# Patient Record
Sex: Female | Born: 1970 | Race: Black or African American | Hispanic: No | Marital: Single | Smoking: Never smoker
Health system: Southern US, Community
[De-identification: ages and names within clinical notes are randomized; demographics above are authoritative.]

## PROBLEM LIST (undated history)

## (undated) DIAGNOSIS — I10 Essential (primary) hypertension: Secondary | ICD-10-CM

## (undated) HISTORY — PX: GALLBLADDER SURGERY: SHX652

---

## 2004-07-31 ENCOUNTER — Emergency Department: Payer: Self-pay | Admitting: Emergency Medicine

## 2004-10-11 ENCOUNTER — Emergency Department: Payer: Self-pay | Admitting: Emergency Medicine

## 2005-04-11 ENCOUNTER — Emergency Department (HOSPITAL_COMMUNITY): Admission: EM | Admit: 2005-04-11 | Discharge: 2005-04-11 | Payer: Self-pay | Admitting: Emergency Medicine

## 2007-01-31 ENCOUNTER — Emergency Department: Payer: Self-pay | Admitting: Emergency Medicine

## 2008-11-13 ENCOUNTER — Emergency Department: Payer: Self-pay | Admitting: Emergency Medicine

## 2010-05-09 ENCOUNTER — Emergency Department: Payer: Self-pay | Admitting: Internal Medicine

## 2011-03-16 ENCOUNTER — Observation Stay: Payer: Self-pay | Admitting: Surgery

## 2011-03-20 LAB — PATHOLOGY REPORT

## 2019-09-30 ENCOUNTER — Encounter: Payer: Self-pay | Admitting: Emergency Medicine

## 2019-09-30 ENCOUNTER — Other Ambulatory Visit: Payer: Self-pay

## 2019-09-30 ENCOUNTER — Emergency Department
Admission: EM | Admit: 2019-09-30 | Discharge: 2019-09-30 | Disposition: A | Discharge status: Home / Self Care | Payer: BC Managed Care – PPO | Attending: Student in an Organized Health Care Education/Training Program | Admitting: Student in an Organized Health Care Education/Training Program

## 2019-09-30 DIAGNOSIS — R21 Rash and other nonspecific skin eruption: Secondary | ICD-10-CM | POA: Diagnosis present

## 2019-09-30 MED ORDER — PREDNISONE 10 MG PO TABS: ORAL_TABLET | ORAL | 0 refills | Status: DC

## 2019-09-30 MED ORDER — METHYLPREDNISOLONE SODIUM SUCC 125 MG IJ SOLR
80.0000 mg | Freq: Once | INTRAMUSCULAR | Status: AC
  Administered 2019-09-30: 80 mg via INTRAMUSCULAR
  Filled 2019-09-30: qty 2

## 2019-09-30 MED ORDER — FAMOTIDINE 20 MG PO TABS
20.0000 mg | ORAL_TABLET | Freq: Once | ORAL | Status: AC
  Administered 2019-09-30: 20 mg via ORAL
  Filled 2019-09-30: qty 1

## 2019-09-30 MED ORDER — DIPHENHYDRAMINE HCL 25 MG PO CAPS: 25.0000 mg | ORAL_CAPSULE | ORAL | 0 refills | Status: DC | PRN

## 2019-09-30 NOTE — ED Provider Notes (Signed)
[facility] Emergency Department Provider Note  ____________________________________________  Time seen: Approximately 7:35 AM  I have reviewed the triage vital signs and the nursing notes.   HISTORY  Chief Complaint Rash    HPI Ashlee Lucas is a 49 y.o. female that presents to the emergency department for evaluation of rash to bilateral forearms, low back, lower legs for 2 days.  Patient states that she woke up on Saturday morning with the rash.  Rash started out as painful and now itches and burns.  Rash itches more than it burns.  Her forearms itch more than her legs.  Her husband and she noticed fire ants in their bed on Saturday.  She did not feel any bites but states that she sleeps very soundly.  Her husband does not have any rash.  Patient does not take any daily medications.  She denies any medical problems.  She has not been in the yard working.  She has not been bitten by any ticks or spiders.  No poison ivy or poison oak contact.  No new lotions, detergents.  She has not taken any medications for symptoms. No recent illness.  No shortness of breath, chest pain.  History reviewed. No pertinent past medical history.  There are no problems to display for this patient.   History reviewed. No pertinent surgical history.  Prior to Admission medications   Medication Sig Start Date End Date Taking? Authorizing Provider  diphenhydrAMINE (BENADRYL) 25 mg capsule Take 1 capsule (25 mg total) by mouth every 4 (four) hours as needed. 09/30/19 09/29/20  Enid Derry, PA-C  predniSONE (DELTASONE) 10 MG tablet Take 6 tablets on day 1, take 5 tablets on day 2, take 4 tablets on day 3, take 3 tablets on day 4, take 2 tablets on day 5, take 1 tablet on day 6 10/01/19   Enid Derry, PA-C    Allergies Patient has no known allergies.  No family history on file.  Social History Social History   Tobacco Use  . Smoking status: Never Smoker  . Smokeless tobacco:  Never Used  Substance Use Topics  . Alcohol use: Not on file  . Drug use: Not on file     Review of Systems  Constitutional: No fever/chills ENT: No upper respiratory complaints. Cardiovascular: No chest pain. Respiratory: No SOB. Gastrointestinal: No nausea, no vomiting.  Musculoskeletal: Negative for musculoskeletal pain. Skin: Negative for abrasions, lacerations, ecchymosis.  Positive for rash. Neurological: Negative for headaches, numbness or tingling   ____________________________________________   PHYSICAL EXAM:  VITAL SIGNS: ED Triage Vitals  Enc Vitals Group     BP 09/30/19 0706 (!) 159/93     Pulse Rate 09/30/19 0706 82     Resp 09/30/19 0706 16     Temp 09/30/19 0706 99 F (37.2 C)     Temp Source 09/30/19 0706 Oral     SpO2 09/30/19 0706 100 %     Weight 09/30/19 0707 145 lb (65.8 kg)     Height 09/30/19 0707 5' (1.524 m)     Head Circumference --      Peak Flow --      Pain Score 09/30/19 0707 0     Pain Loc --      Pain Edu? --      Excl. in GC? --      Constitutional: Alert and oriented. Well appearing and in no acute distress. Eyes: Conjunctivae are normal. PERRL. EOMI. Head: Atraumatic. ENT:  Ears:      Nose: No congestion/rhinnorhea.      Mouth/Throat: Mucous membranes are moist.  Neck: No stridor. Cardiovascular: Normal rate, regular rhythm.  Good peripheral circulation. Respiratory: Normal respiratory effort without tachypnea or retractions. Lungs CTAB. Good air entry to the bases with no decreased or absent breath sounds. Musculoskeletal: Full range of motion to all extremities. No gross deformities appreciated. Neurologic:  Normal speech and language. No gross focal neurologic deficits are appreciated.  Skin:  Skin is warm, dry and intact.  Few scattered 1 cm pink/red macules to lower legs and knees.  Pink/red macules, papules, few vesicles to bilateral forearms.  1 cm macules to lower back. Psychiatric: Mood and affect are normal.  Speech and behavior are normal. Patient exhibits appropriate insight and judgement.   ____________________________________________   LABS (all labs ordered are listed, but only abnormal results are displayed)  Labs Reviewed - No data to display ____________________________________________  EKG   ____________________________________________  RADIOLOGY   No results found.  ____________________________________________    PROCEDURES  Procedure(s) performed:    Procedures    Medications  methylPREDNISolone sodium succinate (SOLU-MEDROL) 125 mg/2 mL injection 80 mg (80 mg Intramuscular Given 09/30/19 0758)  famotidine (PEPCID) tablet 20 mg (20 mg Oral Given 09/30/19 0758)     ____________________________________________   INITIAL IMPRESSION / ASSESSMENT AND PLAN / ED COURSE  Pertinent labs & imaging results that were available during my care of the patient were reviewed by me and considered in my medical decision making (see chart for details).  Review of the [place] CSRS was performed in accordance of the [place] prior to dispensing any controlled drugs.   DDx: Contact or irritant dermatitis, insect bite, purpura, measles, mumps, rubella, molluscum, viral exanthem, drug rash, vasculitis, erythema nodosum, wart  Patient presented to the emergency department for evaluation of rash.  Vital signs and exam are reassuring.  Rash is contained to the forearms and lower legs and appear like a contact or irritant dermatitis.  Rash could be from ant bites, however it is strained the patient did not feel any biting in the night.  Patient will be discharged home with prescriptions for prednisone and benadryl. Patient is to follow up with dermatology or primary care as directed. Patient is given ED precautions to return to the ED for any worsening or new symptoms.   Ashlee Lucas was evaluated in Emergency Department on 09/30/2019 for the symptoms described in the history of present  illness. She was evaluated in the context of the global COVID-19 pandemic, which necessitated consideration that the patient might be at risk for infection with the SARS-CoV-2 virus that causes COVID-19. Institutional protocols and algorithms that pertain to the evaluation of patients at risk for COVID-19 are in a state of rapid change based on information released by regulatory bodies including the CDC and federal and state organizations. These policies and algorithms were followed during the patient's care in the ED.  ____________________________________________  FINAL CLINICAL IMPRESSION(S) / ED DIAGNOSES  Final diagnoses:  Rash      NEW MEDICATIONS STARTED DURING THIS VISIT:  ED Discharge Orders         Ordered    predniSONE (DELTASONE) 10 MG tablet     09/30/19 0759    diphenhydrAMINE (BENADRYL) 25 mg capsule  Every 4 hours PRN     09/30/19 0759              This chart was dictated using voice recognition software/Dragon. Despite best  efforts to proofread, errors can occur which can change the meaning. Any change was purely unintentional.    Enid Derry, PA-C 09/30/19 1248    Willy Eddy, MD 09/30/19 1356

## 2019-09-30 NOTE — Discharge Instructions (Signed)
Please begin Benadryl today.  You can start steroids tomorrow.  Please call dermatology or the wound center today for a follow-up appointment for recheck in 2 days.

## 2019-09-30 NOTE — ED Triage Notes (Signed)
Rash to body x 2 days.

## 2019-09-30 NOTE — ED Notes (Signed)
See triage note  Presents with rash to arms.legs and hair line  States she noticed rash on Saturday  Positive itching   No resp issues at present

## 2021-01-20 ENCOUNTER — Other Ambulatory Visit: Payer: Self-pay

## 2021-01-20 ENCOUNTER — Other Ambulatory Visit (HOSPITAL_COMMUNITY)
Admission: RE | Admit: 2021-01-20 | Discharge: 2021-01-20 | Disposition: A | Discharge status: Home / Self Care | Payer: Managed Care, Other (non HMO) | Source: Ambulatory Visit | Attending: Advanced Practice Midwife | Admitting: Advanced Practice Midwife

## 2021-01-20 ENCOUNTER — Ambulatory Visit (INDEPENDENT_AMBULATORY_CARE_PROVIDER_SITE_OTHER): Payer: Managed Care, Other (non HMO) | Admitting: Advanced Practice Midwife

## 2021-01-20 ENCOUNTER — Encounter: Payer: Self-pay | Admitting: Advanced Practice Midwife

## 2021-01-20 VITALS — BP 156/106 | HR 83 | Ht 61.0 in | Wt 134.0 lb

## 2021-01-20 DIAGNOSIS — Z87891 Personal history of nicotine dependence: Secondary | ICD-10-CM | POA: Diagnosis not present

## 2021-01-20 DIAGNOSIS — R030 Elevated blood-pressure reading, without diagnosis of hypertension: Secondary | ICD-10-CM | POA: Insufficient documentation

## 2021-01-20 DIAGNOSIS — I10 Essential (primary) hypertension: Secondary | ICD-10-CM

## 2021-01-20 DIAGNOSIS — Z01419 Encounter for gynecological examination (general) (routine) without abnormal findings: Secondary | ICD-10-CM | POA: Diagnosis not present

## 2021-01-20 DIAGNOSIS — N939 Abnormal uterine and vaginal bleeding, unspecified: Secondary | ICD-10-CM | POA: Insufficient documentation

## 2021-01-20 LAB — CBC
Hematocrit: 36.6 % (ref 34.0–46.6)
Hemoglobin: 11.3 g/dL (ref 11.1–15.9)
MCH: 26.3 pg — ABNORMAL LOW (ref 26.6–33.0)
MCHC: 30.9 g/dL — ABNORMAL LOW (ref 31.5–35.7)
MCV: 85 fL (ref 79–97)
Platelets: 204 10*3/uL (ref 150–450)
RBC: 4.29 x10E6/uL (ref 3.77–5.28)
RDW: 16.4 % — ABNORMAL HIGH (ref 11.7–15.4)
WBC: 7.9 10*3/uL (ref 3.4–10.8)

## 2021-01-20 LAB — HEMOGLOBIN A1C
Est. average glucose Bld gHb Est-mCnc: 111 mg/dL
Hgb A1c MFr Bld: 5.5 % (ref 4.8–5.6)

## 2021-01-20 NOTE — Progress Notes (Signed)
GYNECOLOGY ANNUAL PREVENTATIVE CARE ENCOUNTER NOTE  History:     LILIANNA CASE is a 50 y.o. [id number] female here for a routine annual gynecologic exam and to establish care.  Current complaints: Patient reports a prolonged episode of heavy bleeding which lasted for about three weeks, then resolved without intervention around 01/05/2021. During that time she saturated a pad in about two hours.  She denies history of abnormal uterine bleeding. Denies abnormal  discharge, pelvic pain, problems with intercourse or other gynecologic concerns.  Abstinence x two months, known partner, no concern for STD exposure. Remote tobacco use. Works as a Tourist information centre manager, very "tedious" work Teacher, music and attention to detail.    Gynecologic History Patient's last menstrual period was 12/08/2020 (approximate). Contraception: none Last Pap: Unsure, about 20 years ago. Result was normal with negative HPV Last Mammogram: Remote.  Result was normal   Obstetric History OB History  Gravida Para Term Preterm AB Living  2 2 2     2   SAB IAB Ectopic Multiple Live Births          2    # Outcome Date GA Lbr Len/2nd Weight Sex Delivery Anes PTL Lv  2 Term           1 Term             No past medical history on file.  Past Surgical History:  Procedure Laterality Date   GALLBLADDER SURGERY      No current outpatient medications on file prior to visit.   No current facility-administered medications on file prior to visit.    No Known Allergies  Social History:  reports that she has never smoked. She has never used smokeless tobacco. She reports that she does not currently use alcohol. She reports that she does not currently use drugs.  Family History  Problem Relation Age of Onset   Breast cancer Sister     The following portions of the patient's history were reviewed and updated as appropriate: allergies, current medications, past family history, past medical history, past social history,  past surgical history and problem list.  Review of Systems Pertinent items noted in HPI and remainder of comprehensive ROS otherwise negative.  Physical Exam:  BP (!) 156/106   Pulse 83   Ht 5\' 1"  (1.549 m)   Wt 134 lb (60.8 kg)   LMP 12/08/2020 (Approximate)   BMI 25.32 kg/m  CONSTITUTIONAL: Well-developed, well-nourished female in no acute distress.  HENT:  Normocephalic, atraumatic, External right and left ear normal.  EYES: Conjunctivae and EOM are normal. Pupils are equal, round, and reactive to light. No scleral icterus.  NECK: Normal range of motion, supple, no masses.  Normal thyroid.  SKIN: Skin is warm and dry. No rash noted. Not diaphoretic. No erythema. No pallor. MUSCULOSKELETAL: Normal range of motion. No tenderness.  No cyanosis, clubbing, or edema. NEUROLOGIC: Alert and oriented to person, place, and time. Normal reflexes, muscle tone coordination.  PSYCHIATRIC: Normal mood and affect. Normal behavior. Normal judgment and thought content. CARDIOVASCULAR: Normal heart rate noted, regular rhythm RESPIRATORY: Clear to auscultation bilaterally. Effort and breath sounds normal, no problems with respiration noted. BREASTS: Symmetric in size. No masses, tenderness, skin changes, nipple drainage, or lymphadenopathy bilaterally. Performed in the presence of a chaperone. ABDOMEN: Soft, no distention noted.  No tenderness, rebound or guarding.  PELVIC: Normal appearing external genitalia and urethral meatus; normal appearing vaginal mucosa and cervix.  No abnormal vaginal discharge noted.  Pap  smear obtained.  Normal uterine size, no other palpable masses, no uterine or adnexal tenderness.  Performed in the presence of a chaperone.   Assessment and Plan:    1. Well woman exam with routine gynecological exam - 20 years since more recent medical evaluation. Lovingly reminded to prioritize her health and find ways to de-stress/perform self-care - Cytology - PAP - CBC - TSH -  Hemoglobin A1c - Comprehensive metabolic panel - Lipid panel - Hepatitis C antibody - MM 3D SCREEN BREAST BILATERAL; Future  2. Abnormal uterine bleeding - 1 episode lasting three weeks, resolved without intervention - No abnormal findings on pelvic exam today - Advised patient to call office and schedule MD appointment as soon as she has a return of bleeding to assess overall trend and implement medication as appropriate - US PELVIC COMPLETE WITH TRANSVAGINAL; Future  3. Primary hypertension - 156/106  - Will refer to PCP to establish care, warned wait for appointment may be long - Will discuss with MD and advise patient once labs result to avoid multiple lines of contact with patient   4. Former tobacco use - Remote  Will follow up results of pap smear and manage accordingly. Mammogram scheduled Routine preventative health maintenance measures emphasized. Please refer to After Visit Summary for other counseling recommendations.     Total visit time: 45 min. Greater than 50% of visit spent in counseling and coordination of care  Clayton Bibles, MSA, MSN, CNM Certified Nurse Midwife, Biochemist, clinical for [employer], [facility]

## 2021-01-20 NOTE — Progress Notes (Signed)
History of regular cycles every month that last 7 days , and this past month it last 3 weeks -bleeding the whole time, bleeding finally stopped on 9/13.  Last gyn visit over 20 years

## 2021-01-21 LAB — COMPREHENSIVE METABOLIC PANEL WITH GFR
ALT: 14 IU/L (ref 0–32)
AST: 19 IU/L (ref 0–40)
Albumin/Globulin Ratio: 1.7 (ref 1.2–2.2)
Albumin: 4.8 g/dL (ref 3.8–4.8)
Alkaline Phosphatase: 63 IU/L (ref 44–121)
BUN/Creatinine Ratio: 11 (ref 9–23)
BUN: 10 mg/dL (ref 6–24)
Bilirubin Total: <0.2 mg/dL (ref 0.0–1.2)
CO2: 20 mmol/L (ref 20–29)
Calcium: 9.9 mg/dL (ref 8.7–10.2)
Chloride: 103 mmol/L (ref 96–106)
Creatinine, Ser: 0.88 mg/dL (ref 0.57–1.00)
Globulin, Total: 2.9 g/dL (ref 1.5–4.5)
Glucose: 94 mg/dL (ref 70–99)
Potassium: 3.7 mmol/L (ref 3.5–5.2)
Sodium: 139 mmol/L (ref 134–144)
Total Protein: 7.7 g/dL (ref 6.0–8.5)
eGFR: 81 mL/min/1.73

## 2021-01-21 LAB — HEPATITIS C ANTIBODY: Hep C Virus Ab: <0.1 {s_co_ratio} (ref 0.0–0.9)

## 2021-01-21 LAB — LIPID PANEL
Chol/HDL Ratio: 3.1 ratio (ref 0.0–4.4)
Cholesterol, Total: 252 mg/dL — ABNORMAL HIGH (ref 100–199)
HDL: 81 mg/dL
LDL Chol Calc (NIH): 140 mg/dL — ABNORMAL HIGH (ref 0–99)
Triglycerides: 176 mg/dL — ABNORMAL HIGH (ref 0–149)
VLDL Cholesterol Cal: 31 mg/dL (ref 5–40)

## 2021-01-21 LAB — TSH: TSH: 2.24 u[IU]/mL (ref 0.450–4.500)

## 2021-01-25 LAB — CYTOLOGY - PAP
Chlamydia: NEGATIVE
Comment: NEGATIVE
Comment: NEGATIVE
Comment: NEGATIVE
Comment: NORMAL
Diagnosis: NEGATIVE
High risk HPV: NEGATIVE
Neisseria Gonorrhea: NEGATIVE
Trichomonas: POSITIVE — AB

## 2021-01-26 ENCOUNTER — Telehealth: Payer: Self-pay | Admitting: *Deleted

## 2021-01-26 MED ORDER — HYDROCHLOROTHIAZIDE 25 MG PO TABS: 25.0000 mg | ORAL_TABLET | Freq: Every day | ORAL | 1 refills | Status: DC

## 2021-01-26 NOTE — Telephone Encounter (Signed)
-----   Message from Calvert Cantor, [place] sent at 01/21/2021  1:39 PM EDT ----- Regarding: FW: Can you please touch base to see if she's made an appointment to establish care with pcp (I bet she hasn't since we just saw her yesterday). Per Dr A we can start HCTZ 25 mg daily to get her bp back under control. Can you please send that to her pharmacy? Thanks for all you do all the time!!! SW ----- Message ----- From: Interface, [facility] Lab Results In Sent: 01/20/2021  11:35 PM EDT To: Calvert Cantor, CNM

## 2021-01-26 NOTE — Telephone Encounter (Signed)
Called pt to follow up , pt still trying to find a PC that is accepting new patients. Informed pt that we will start her on some BP meds until she can get int with someone.

## 2021-01-29 ENCOUNTER — Other Ambulatory Visit: Payer: Self-pay

## 2021-01-29 ENCOUNTER — Ambulatory Visit
Admission: RE | Admit: 2021-01-29 | Discharge: 2021-01-29 | Disposition: A | Discharge status: Home / Self Care | Payer: Managed Care, Other (non HMO) | Source: Ambulatory Visit | Attending: Advanced Practice Midwife | Admitting: Advanced Practice Midwife

## 2021-01-29 DIAGNOSIS — N939 Abnormal uterine and vaginal bleeding, unspecified: Secondary | ICD-10-CM | POA: Diagnosis not present

## 2021-01-31 ENCOUNTER — Telehealth: Payer: Self-pay | Admitting: Advanced Practice Midwife

## 2021-01-31 NOTE — Telephone Encounter (Signed)
VLTCB regarding ultrasound results. PT scheduled with Dr. Hyacinth Meeker on 03/15/2021  Clayton Bibles, MSN, CNM Certified Nurse Midwife, [facility] for [facility], [facility] 01/31/21 10:35 AM

## 2021-02-05 ENCOUNTER — Other Ambulatory Visit: Payer: Self-pay

## 2021-02-05 ENCOUNTER — Ambulatory Visit
Admission: RE | Admit: 2021-02-05 | Discharge: 2021-02-05 | Disposition: A | Discharge status: Home / Self Care | Payer: Managed Care, Other (non HMO) | Source: Ambulatory Visit | Attending: Advanced Practice Midwife | Admitting: Advanced Practice Midwife

## 2021-02-05 ENCOUNTER — Telehealth: Payer: Self-pay | Admitting: Certified Nurse Midwife

## 2021-02-05 DIAGNOSIS — Z1231 Encounter for screening mammogram for malignant neoplasm of breast: Secondary | ICD-10-CM | POA: Diagnosis not present

## 2021-02-05 DIAGNOSIS — Z01419 Encounter for gynecological examination (general) (routine) without abnormal findings: Secondary | ICD-10-CM | POA: Insufficient documentation

## 2021-02-05 NOTE — Telephone Encounter (Signed)
Informed of [place] results. Small fibroids seen and thickened endometrium, may be cause of bleeding. Has f/u with Dr. Hyacinth Meeker. Informed she can call office with concerning sx.

## 2021-03-15 ENCOUNTER — Ambulatory Visit (HOSPITAL_BASED_OUTPATIENT_CLINIC_OR_DEPARTMENT_OTHER): Payer: BC Managed Care – PPO | Admitting: Obstetrics & Gynecology

## 2021-03-24 ENCOUNTER — Other Ambulatory Visit: Payer: Self-pay | Admitting: Obstetrics & Gynecology

## 2021-07-09 ENCOUNTER — Other Ambulatory Visit: Payer: Self-pay | Admitting: Obstetrics & Gynecology

## 2022-05-17 IMAGING — MG MM DIGITAL SCREENING BILAT W/ TOMO AND CAD
8 series · 8 of 24 positions shown · non-contrast
Comparison: None.

CLINICAL DATA: Screening.

EXAM:
DIGITAL SCREENING BILATERAL MAMMOGRAM WITH TOMOSYNTHESIS AND CAD
TECHNIQUE: Bilateral screening digital craniocaudal and mediolateral oblique
mammograms were obtained. Bilateral screening digital breast
tomosynthesis was performed. The images were evaluated with
computer-aided detection.

[L MLO synth-2D]
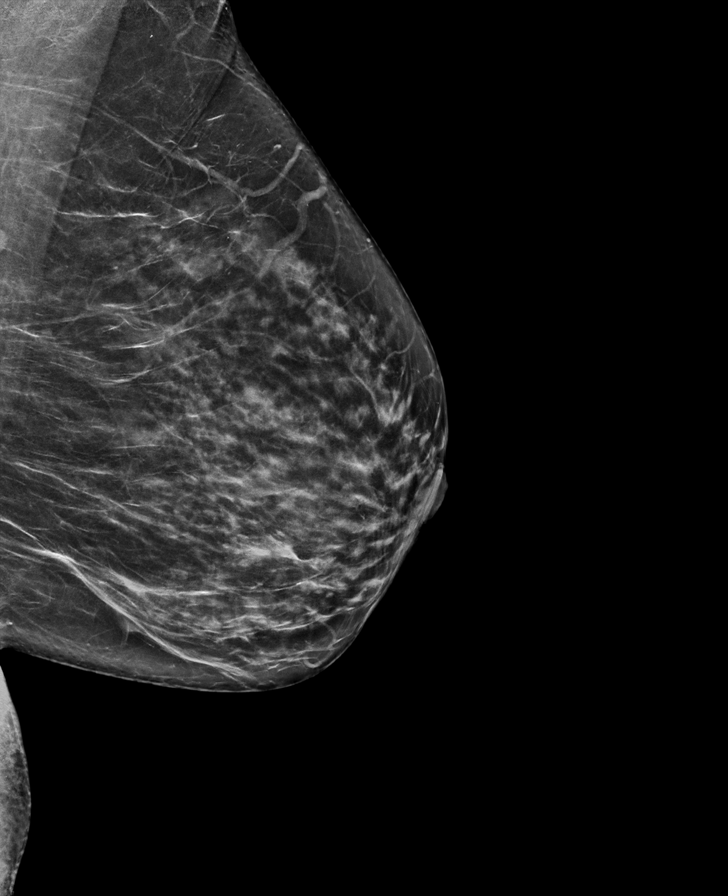

[R CC synth-2D]
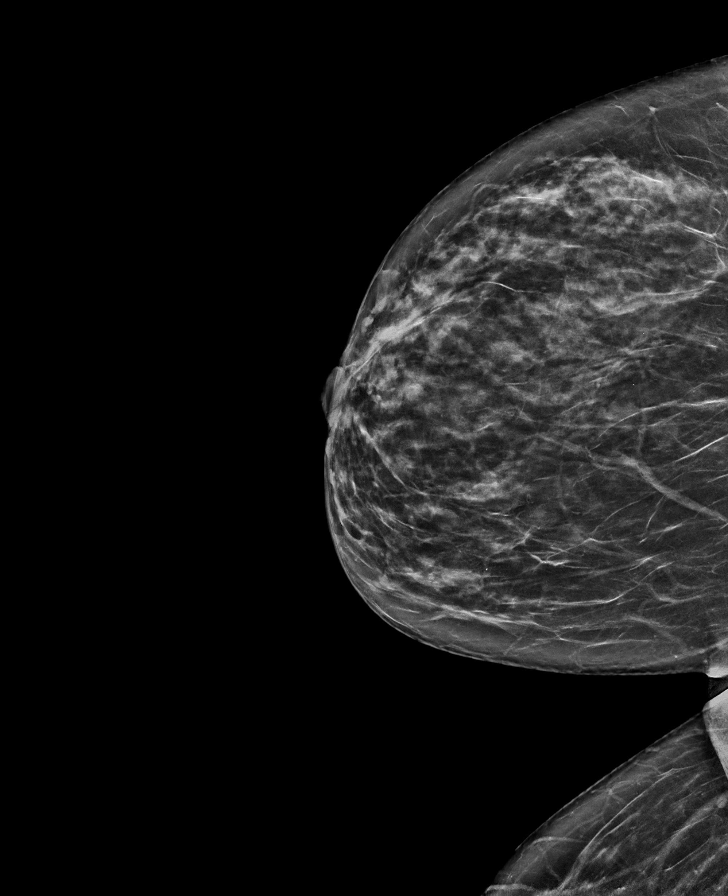

[R MLO synth-2D]
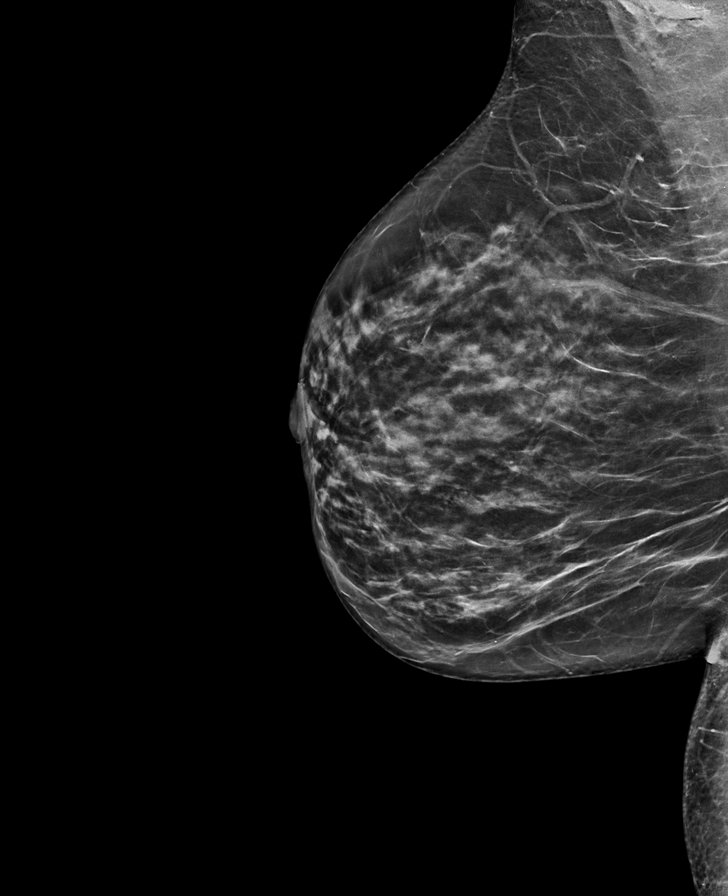

[L CC synth-2D]
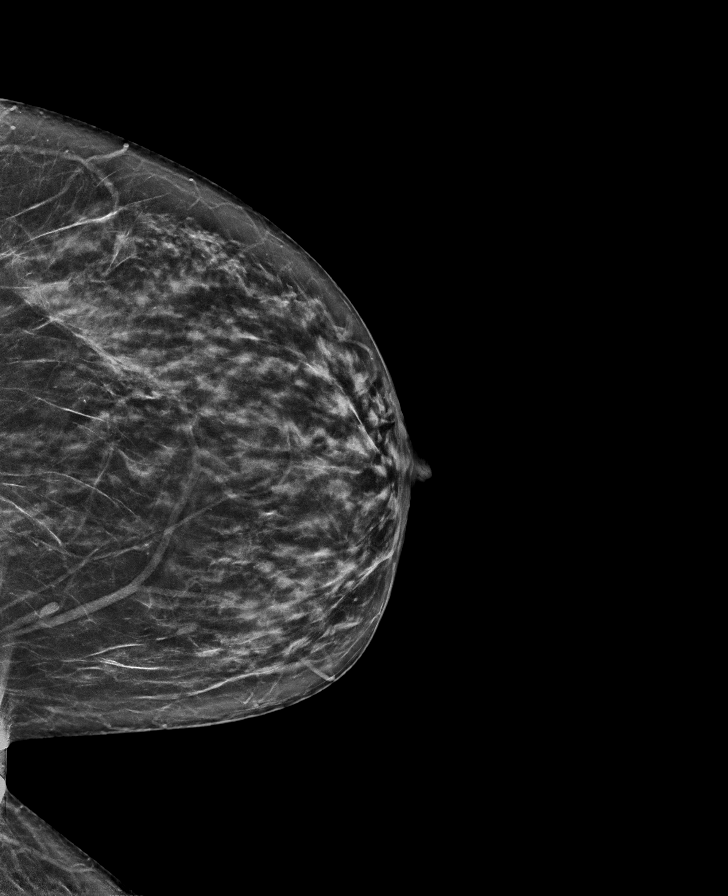

[R MLO tomo · tomo slice 35/68.0]
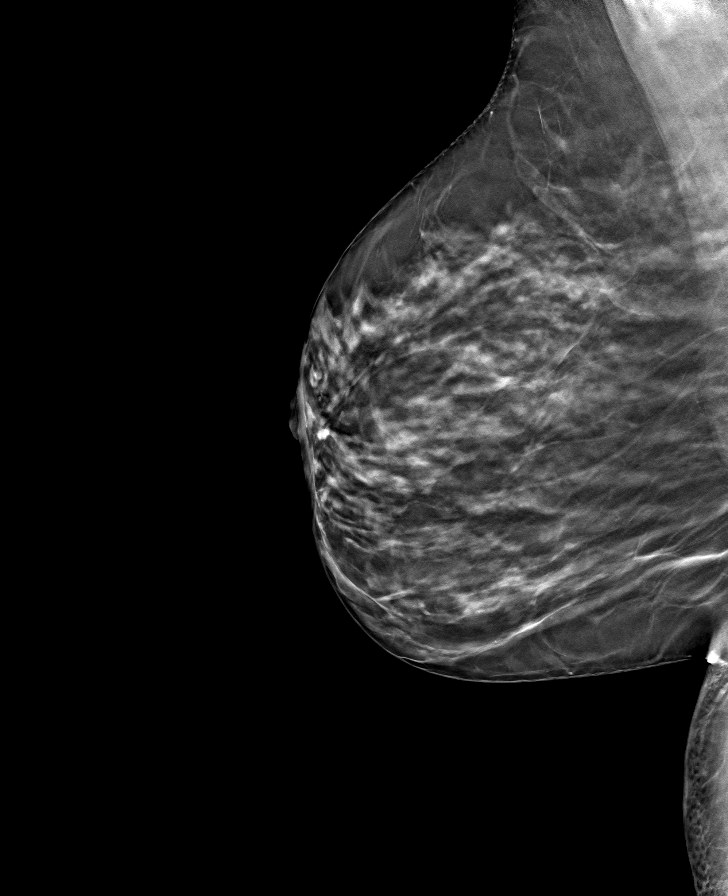

[R CC tomo · tomo slice 33/66.0]
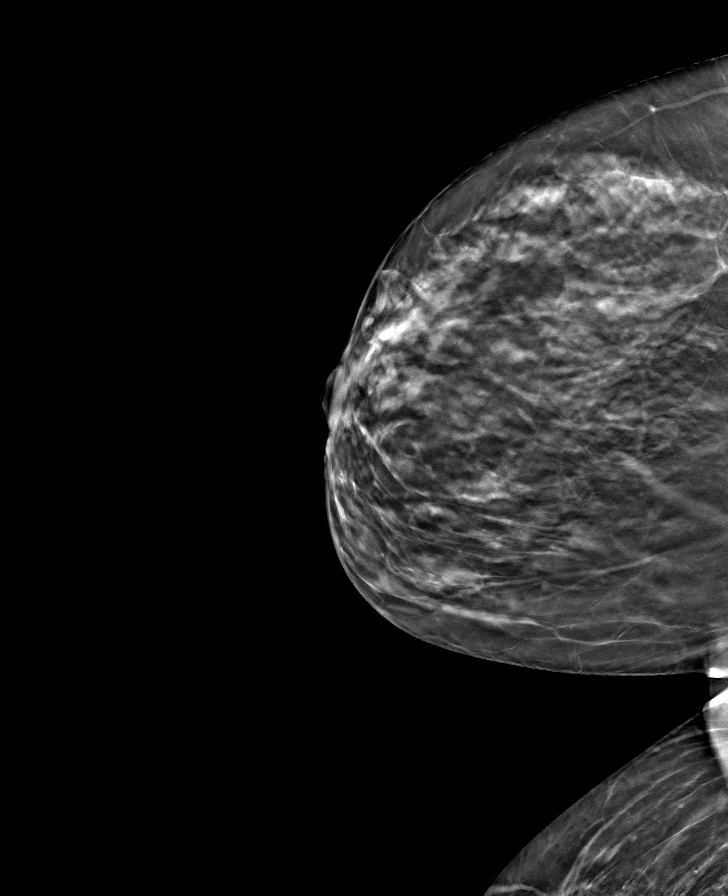

[L MLO tomo · tomo slice 35/69.0]
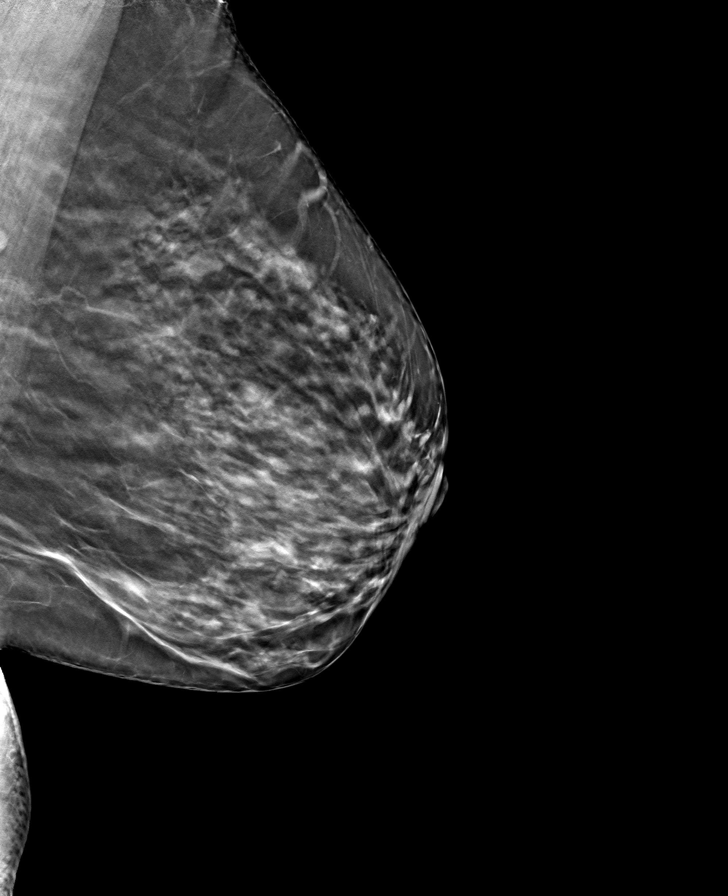

[L CC tomo · tomo slice 33/65.0]
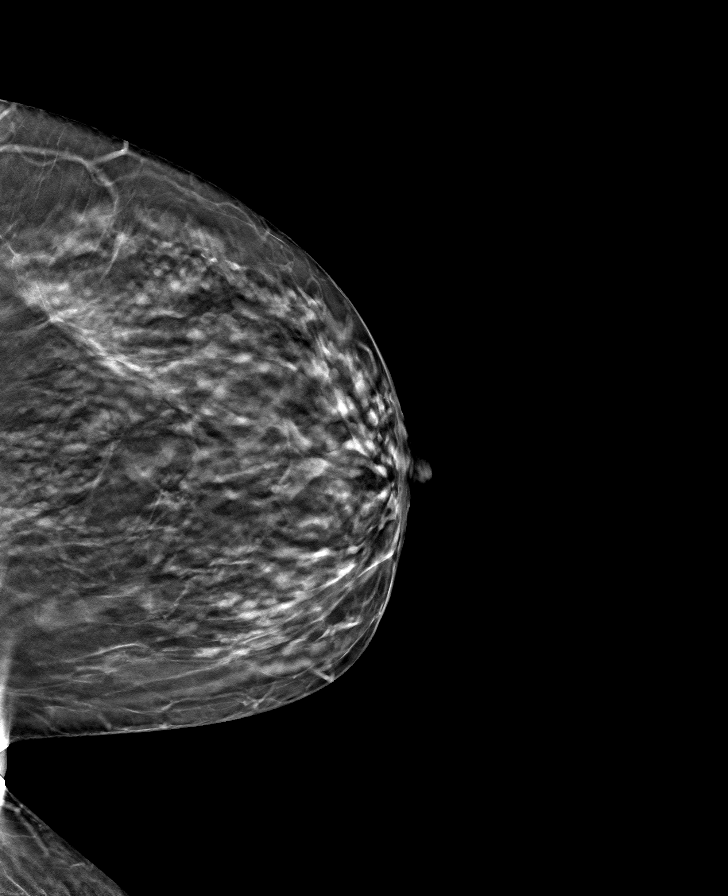

[8 of 24 positions shown; findings below may reference images not displayed]

ACR Breast Density Category c: The breast tissue is heterogeneously
dense, which may obscure small masses
FINDINGS: There are no findings suspicious for malignancy.
IMPRESSION: No mammographic evidence of malignancy. A result letter of this
screening mammogram will be mailed directly to the patient.

RECOMMENDATION:
Screening mammogram in one year. (Code:C8-T-HNK)

BI-RADS CATEGORY  1: Negative.

## 2023-12-29 ENCOUNTER — Encounter: Payer: Self-pay | Admitting: Emergency Medicine

## 2023-12-29 ENCOUNTER — Other Ambulatory Visit: Payer: Self-pay

## 2023-12-29 ENCOUNTER — Emergency Department
Admission: EM | Admit: 2023-12-29 | Discharge: 2023-12-29 | Disposition: A | Discharge status: Home / Self Care | Attending: Emergency Medicine | Admitting: Emergency Medicine

## 2023-12-29 DIAGNOSIS — M79671 Pain in right foot: Secondary | ICD-10-CM | POA: Diagnosis present

## 2023-12-29 DIAGNOSIS — Y990 Civilian activity done for income or pay: Secondary | ICD-10-CM | POA: Diagnosis not present

## 2023-12-29 DIAGNOSIS — X501XXA Overexertion from prolonged static or awkward postures, initial encounter: Secondary | ICD-10-CM | POA: Diagnosis not present

## 2023-12-29 DIAGNOSIS — M722 Plantar fascial fibromatosis: Secondary | ICD-10-CM | POA: Insufficient documentation

## 2023-12-29 DIAGNOSIS — I10 Essential (primary) hypertension: Secondary | ICD-10-CM | POA: Diagnosis not present

## 2023-12-29 HISTORY — DX: Essential (primary) hypertension: I10

## 2023-12-29 MED ORDER — MELOXICAM 15 MG PO TABS: 15.0000 mg | ORAL_TABLET | Freq: Every day | ORAL | 0 refills | Status: AC

## 2023-12-29 NOTE — ED Triage Notes (Signed)
 C/O pain to posterior right foot x several weeks. Initially pain worse when standing, now hurting when sleeping as well.  Denies injury

## 2023-12-29 NOTE — Discharge Instructions (Signed)
 Your pain is due to plantar fasciitis.  I have attached information about this condition as well as exercises to help with it.  Schedule follow-up appointment with your primary care provider as needed.  If you have continued pain they can refer you to a foot specialist.  Please take the meloxicam  (Mobic ) once a day for 2 weeks.  This is an anti-inflammatory.  Do not take other NSAIDs while taking this medication.  NSAIDs include ibuprofen, Motrin, Advil, naproxen, Aleve, celecoxib, and Celebrex.  You can take 650 mg of Tylenol every 6 hours as needed for pain. You can use ice, heat, muscle creams and other topical pain relievers as well.

## 2023-12-29 NOTE — ED Provider Notes (Signed)
 [facility] Provider Note    Event Date/Time   First MD Initiated Contact with Patient 12/29/23 1148     (approximate)   History   Foot Pain   HPI  Ashlee Lucas is a 53 y.o. female with PMH of hypertension who presents for evaluation of right foot pain for several weeks. She report the pain is located on the bottom of her foot from the middle of her arch to her heel. She does a lot of walking and standing at work.      Physical Exam   Triage Vital Signs: ED Triage Vitals  Encounter Vitals Group     BP 12/29/23 1041 (!) 167/110     Girls Systolic BP Percentile --      Girls Diastolic BP Percentile --      Boys Systolic BP Percentile --      Boys Diastolic BP Percentile --      Pulse Rate 12/29/23 1041 84     Resp 12/29/23 1041 16     Temp 12/29/23 1041 98.3 F (36.8 C)     Temp Source 12/29/23 1041 Oral     SpO2 12/29/23 1041 100 %     Weight 12/29/23 1037 134 lb 0.6 oz (60.8 kg)     Height --      Head Circumference --      Peak Flow --      Pain Score 12/29/23 1036 10     Pain Loc --      Pain Education --      Exclude from Growth Chart --     Most recent vital signs: Vitals:   12/29/23 1041 12/29/23 1114  BP: (!) 167/110   Pulse: 84   Resp: 16   Temp: 98.3 F (36.8 C)   SpO2: 100% 100%    General: Awake, no distress.  CV:  Good peripheral perfusion.  Resp:  Normal effort.  Abd:  No distention.  Other:  Right foot is not swollen when compared with the left, no overlying skin changes, TTP from the middle of the arch to the heel, no TTP over the achilles. Dorsalis pedis pulse is 2+ and regular.   ED Results / Procedures / Treatments   Labs (all labs ordered are listed, but only abnormal results are displayed) Labs Reviewed - No data to display    PROCEDURES:  Critical Care performed: No  Procedures   MEDICATIONS ORDERED IN ED: Medications - No data to display   IMPRESSION / MDM / ASSESSMENT AND PLAN / ED  COURSE  I reviewed the triage vital signs and the nursing notes.                             53 year old female presents for right foot pain. BP is elevated, VSS otherwise. Patient NAD on exam.   Differential diagnosis includes, but is not limited to, plantar fasciitis, achilles tendonitis, foot sprain.  Patient's presentation is most consistent with acute, uncomplicated illness.  History and exam consistent with plantar fasciitis. Discussed supportive footwear, icing, stretching and NSAIDs. Will prescribe meloxiam. Also advised tylenol. Follow up with primary care as needed. Patient voiced understanding, all questions were answered, patient stable at discharge.       FINAL CLINICAL IMPRESSION(S) / ED DIAGNOSES   Final diagnoses:  Plantar fasciitis of right foot     Rx / DC Orders   ED Discharge Orders  None        Note:  This document was prepared using Dragon voice recognition software and may include unintentional dictation errors.   Cleaster Tinnie LABOR, PA-C 12/29/23 1218    Willo Dunnings, MD 12/29/23 [phone]
